# Patient Record
Sex: Male | Born: 2000 | Race: Black or African American | Hispanic: No | Marital: Single | Smoking: Never smoker
Health system: Southern US, Community
[De-identification: ages and names within clinical notes are randomized; demographics above are authoritative.]

---
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
## 2018-02-17 IMAGING — MR MR KNEE*R* W/O CM
6 series · 40 of 40 positions shown · non-contrast
Comparison: Radiographs dated 07/04/2016

CLINICAL DATA: Anterior pain and swelling since a basketball injury
last [REDACTED].

EXAM:
MRI OF THE RIGHT KNEE WITHOUT CONTRAST
TECHNIQUE: Multiplanar, multisequence MR imaging of the knee was performed. No
intravenous contrast was administered.

[Series 3: PD fat-sat · axial · 3.0mm · 0.33mm/px · z∈[-20,+92]mm · 7 of 35 slices shown (1 of 4)]
[im 1/35]
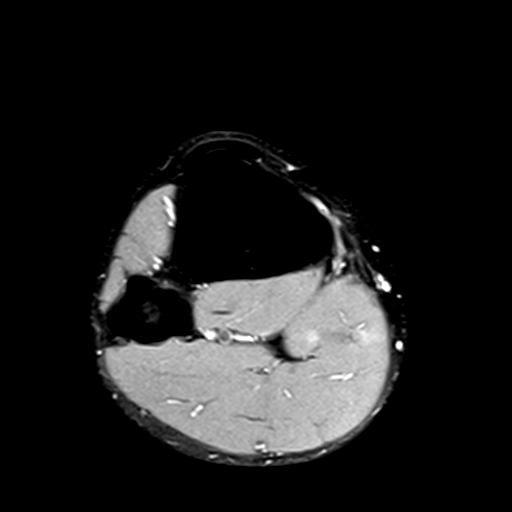
[im 6/35]
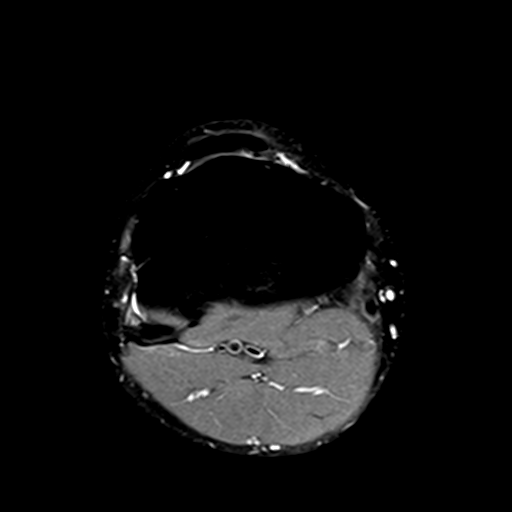
[im 12/35]
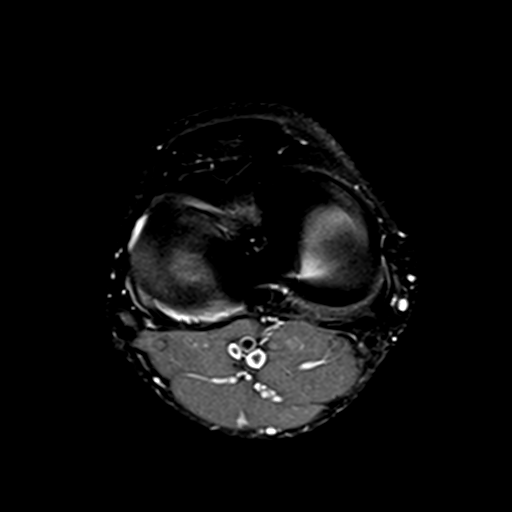
[im 18/35]
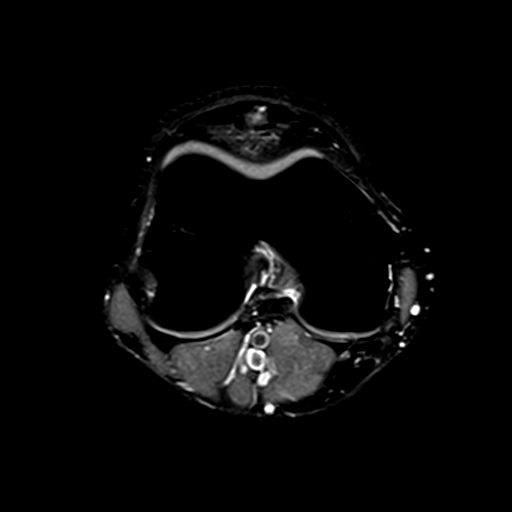
[im 23/35]
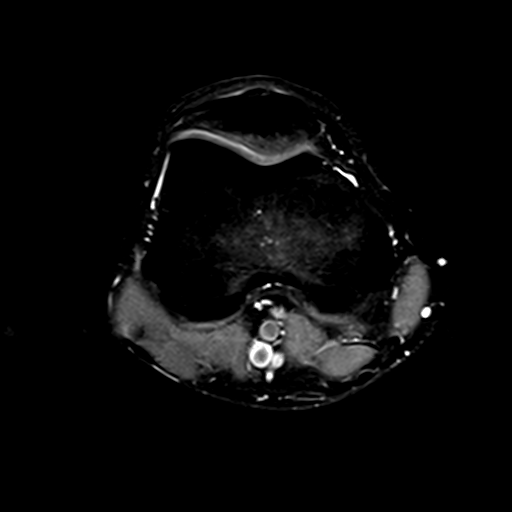
[im 29/35]
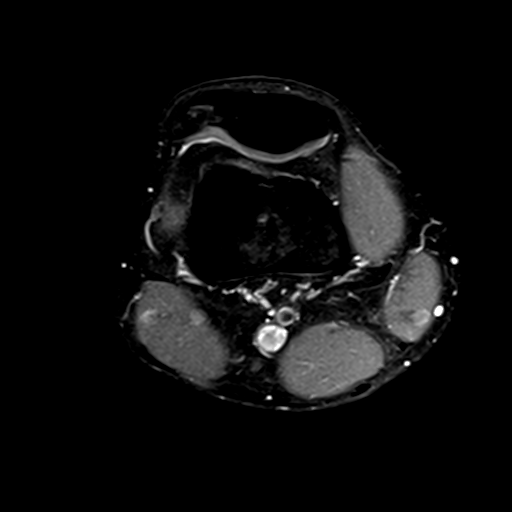
[im 35/35]
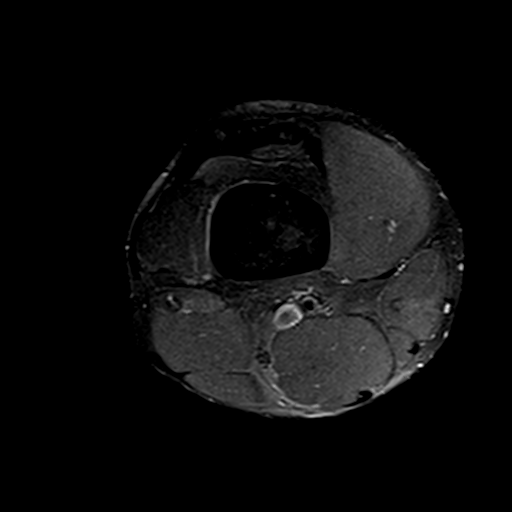

[Series 4: T1 · coronal · 3.0mm · 0.50mm/px · 7 of 31 slices shown]
[im 1/31]
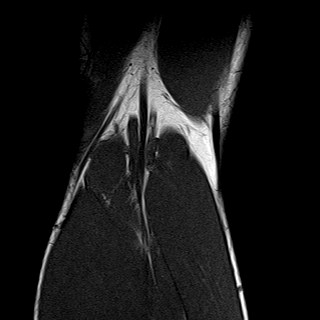
[im 6/31]
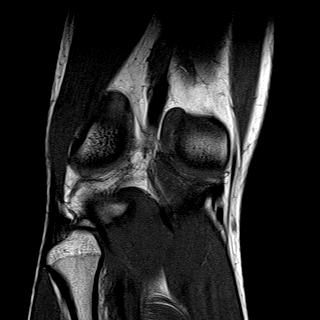
[im 11/31]
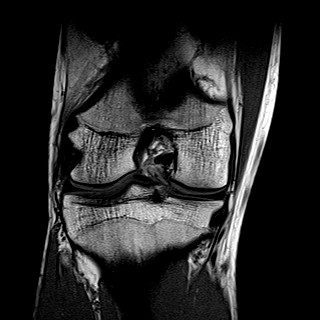
[im 16/31]
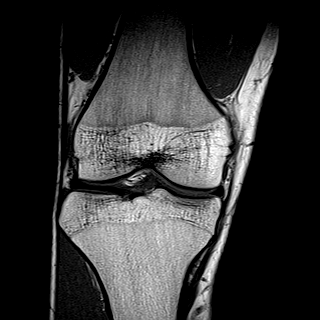
[im 21/31]
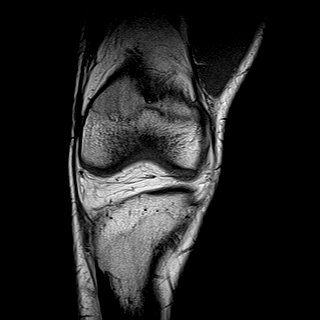
[im 26/31]
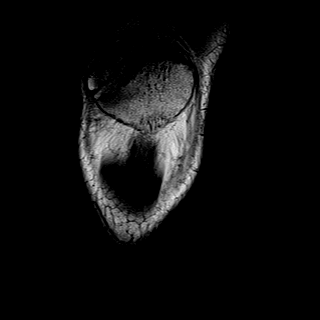
[im 31/31]
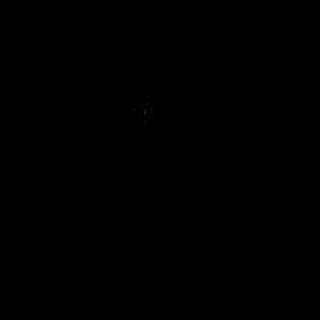

[Series 5: T2 fat-sat · coronal · 3.0mm · 0.50mm/px · 7 of 31 slices shown]
[im 1/31]
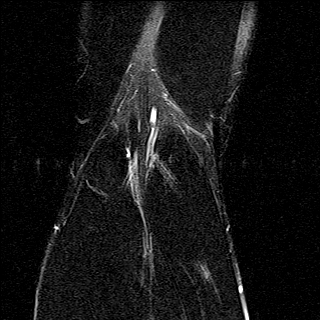
[im 6/31]
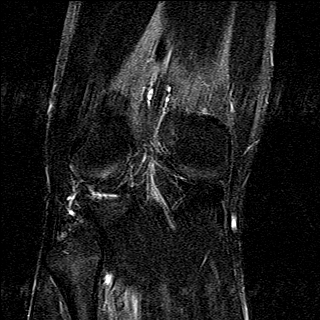
[im 11/31]
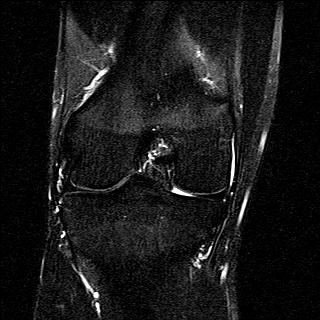
[im 16/31]
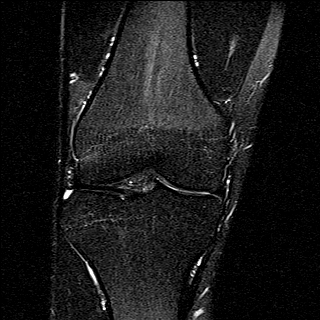
[im 21/31]
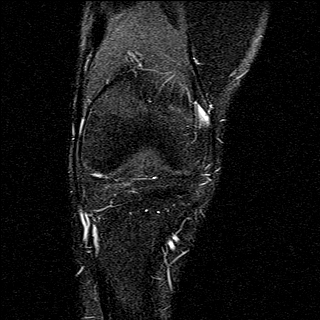
[im 26/31]
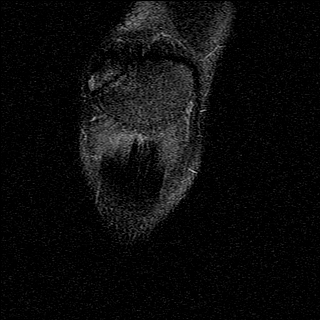
[im 31/31]
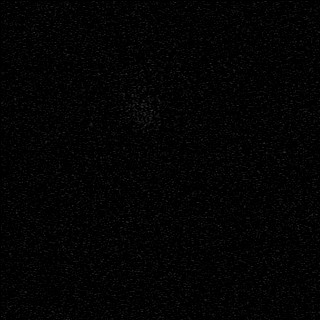

[Series 6: PD fat-sat · coronal · 3.0mm · 0.62mm/px · 7 of 31 slices shown (2 of 4)]
[im 1/31]
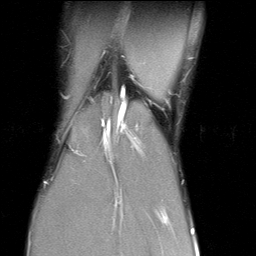
[im 6/31]
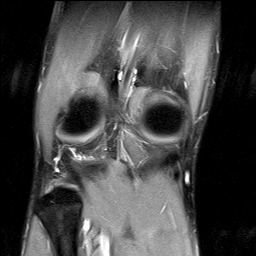
[im 11/31]
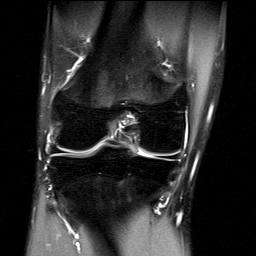
[im 16/31]
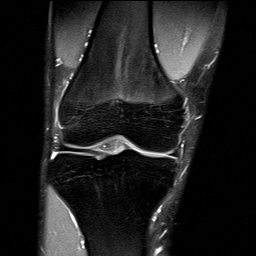
[im 21/31]
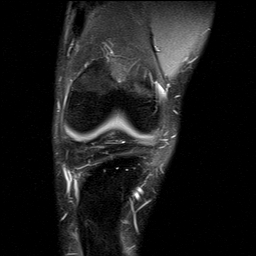
[im 26/31]
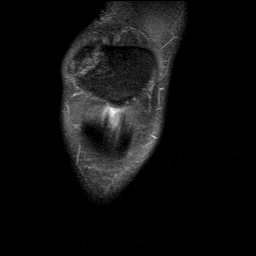
[im 31/31]
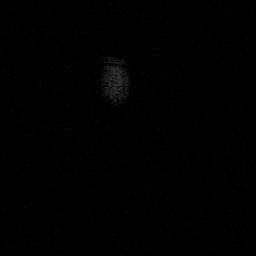

[Series 7: PD fat-sat · sagittal · 3.0mm · 0.62mm/px · 8 of 35 slices shown (3 of 4)]
[im 1/35]
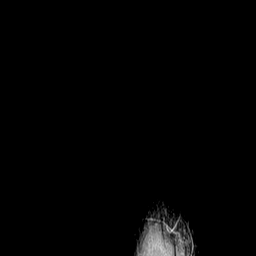
[im 5/35]
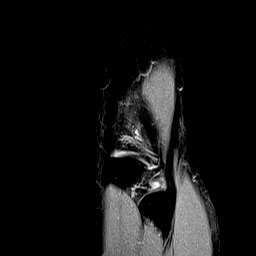
[im 10/35]
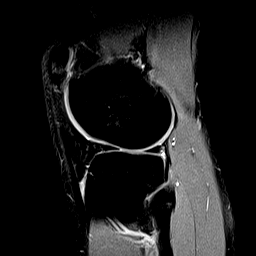
[im 15/35]
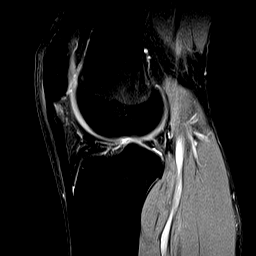
[im 20/35]
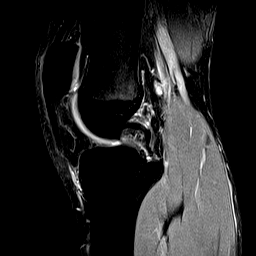
[im 25/35]
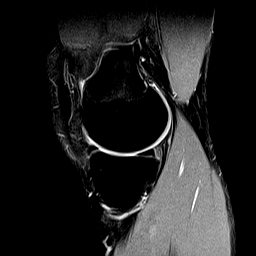
[im 30/35]
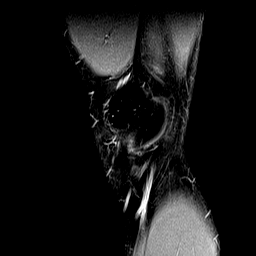
[im 35/35]
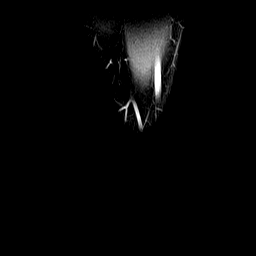

[Series 8: PD fat-sat · oblique · 2.0mm · 0.62mm/px · 4 of 19 slices shown (4 of 4)]
[im 1/19]
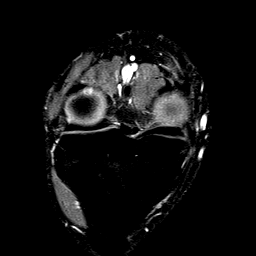
[im 7/19]
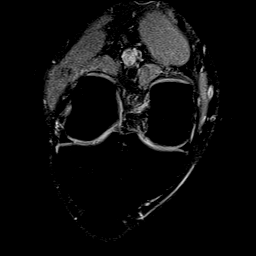
[im 13/19]
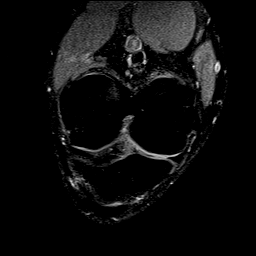
[im 19/19]
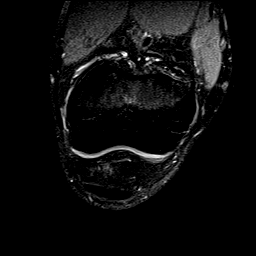

[40 of 40 positions shown; findings below may reference images not displayed]

FINDINGS: MENISCI

Medial meniscus:  Normal.

Lateral meniscus:  Normal.

LIGAMENTS

Cruciates:  Intact ACL and PCL.

Collaterals: Medial collateral ligament is intact. Lateral
collateral ligament complex is intact.

CARTILAGE

Patellofemoral: Normal. Bipartite patella with intact overlying
articular cartilage.

Medial:  Normal.

Lateral:  Normal.

Joint:  Normal.

Popliteal Fossa:  Normal.

Extensor Mechanism: Focal patellar tendinitis with a small
intrasubstance tear of the central portion of the proximal patellar
tendon. Distal quadriceps tendon is normal.

Bones:  Normal.

Other: None
IMPRESSION: Focal patellar tendinitis with a small intrasubstance tear of the
central portion of the proximal patellar tendon.

## 2018-07-04 ENCOUNTER — Other Ambulatory Visit: Payer: Self-pay

## 2018-07-04 ENCOUNTER — Ambulatory Visit (INDEPENDENT_AMBULATORY_CARE_PROVIDER_SITE_OTHER): Payer: BC Managed Care – PPO

## 2018-07-04 ENCOUNTER — Encounter: Payer: Self-pay | Admitting: Sports Medicine

## 2018-07-04 ENCOUNTER — Ambulatory Visit (INDEPENDENT_AMBULATORY_CARE_PROVIDER_SITE_OTHER): Payer: BC Managed Care – PPO | Admitting: Sports Medicine

## 2018-07-04 DIAGNOSIS — M25532 Pain in left wrist: Secondary | ICD-10-CM | POA: Diagnosis not present

## 2018-07-04 MED ORDER — DICLOFENAC SODIUM 75 MG PO TBEC: 75.0000 mg | DELAYED_RELEASE_TABLET | Freq: Two times a day (BID) | ORAL | 3 refills | Status: AC

## 2018-07-04 NOTE — Patient Instructions (Signed)
Return to see me in a month, MRI arthrogram if no better. Visit needs to be in person on a Monday in case we need to do the arthrogram on the same day.

## 2018-07-04 NOTE — Progress Notes (Signed)
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  4 years ago he had a Salter-Harris type II fracture of the distal radius, underwent closed reduction in the emergency department.  He was anatomic, eventually did well.  He did have complete relief of his pain.  Unfortunately soon after he healed he developed recurrence of pain at the radiocarpal joint, it was for the most part mild until recently, started to get worse.  No mechanical symptoms, no new trauma.  I reviewed the past medical history, family history, social history, surgical history, and allergies today and no changes were needed.  Please see the problem list section below in epic for further details.  Past Medical History: No past medical history on file. Past Surgical History: No past surgical history on file. Social History: Social History   Socioeconomic History  . Marital status: Single    Spouse name: Not on file  . Number of children: Not on file  . Years of education: Not on file  . Highest education level: Not on file  Occupational History  . Not on file  Social Needs  . Financial resource strain: Not on file  . Food insecurity:    Worry: Not on file    Inability: Not on file  . Transportation needs:    Medical: Not on file    Non-medical: Not on file  Tobacco Use  . Smoking status: Never Smoker  . Smokeless tobacco: Never Used  Substance and Sexual Activity  . Alcohol use: Not on file  . Drug use: Not on file  . Sexual activity: Not on file  Lifestyle  . Physical activity:    Days per week: Not on file    Minutes per session: Not on file  . Stress: Not on file  Relationships  . Social connections:    Talks on phone: Not on file    Gets together: Not on file    Attends religious service: Not on file    Active member of club or organization: Not on file    Attends meetings of clubs or organizations: Not on file    Relationship  status: Not on file  Other Topics Concern  . Not on file  Social History Narrative  . Not on file   Family History: No family history on file. Allergies: Allergies  Allergen Reactions  . Other Rash    Peanuts  (MILD RX PER MOM)   Medications: See med rec.  Review of Systems: No fevers, chills, night sweats, weight loss, chest pain, or shortness of breath.   Objective:    General: Well Developed, well nourished, and in no acute distress.  Neuro: Alert and oriented x3, extra-ocular muscles intact, sensation grossly intact.  HEENT: Normocephalic, atraumatic, pupils equal round reactive to light, neck supple, no masses, no lymphadenopathy, thyroid nonpalpable.  Skin: Warm and dry, no rashes. Cardiac: Regular rate and rhythm, no murmurs rubs or gallops, no lower extremity edema.  Respiratory: Clear to auscultation bilaterally. Not using accessory muscles, speaking in full sentences. Left wrist: Inspection normal with no visible erythema or swelling. ROM smooth and normal with good flexion and extension and ulnar/radial deviation that is symmetrical with opposite wrist. Palpation is normal over metacarpals, navicular, lunate, and TFCC; tendons without tenderness/ swelling No snuffbox tenderness. No tenderness over Canal of [place]. Strength 5/5 in all directions without pain. Negative tinel's and phalens signs. Negative Finkelstein sign. Negative Watson's  test.  Impression and Recommendations:    Left wrist pain Persistent pain at the radiocarpal joint. He did have a closed reduction of a Salter-Harris type II distal radius fracture at an outside emergency department 4 years ago. Velcro brace, x-rays, formal physical therapy. Return to see me in a month, MRI arthrogram if no better. Visit needs to be in person on a Monday in case we need to do the arthrogram on the same day.   ___________________________________________ Ihor Austin. Benjamin Stain, M.D., ABFM., CAQSM. Primary Care  and Sports Medicine [facility]  Adjunct Professor of Family Medicine  [employer]

## 2018-07-04 NOTE — Assessment & Plan Note (Signed)
Persistent pain at the radiocarpal joint. He did have a closed reduction of a Salter-Harris type II distal radius fracture at an outside emergency department 4 years ago. Velcro brace, x-rays, formal physical therapy. Return to see me in a month, MRI arthrogram if no better. Visit needs to be in person on a Monday in case we need to do the arthrogram on the same day.

## 2018-08-04 ENCOUNTER — Ambulatory Visit: Payer: BC Managed Care – PPO | Admitting: Sports Medicine

## 2019-11-03 ENCOUNTER — Encounter: Payer: Self-pay | Admitting: Sports Medicine

## 2019-11-03 ENCOUNTER — Ambulatory Visit (INDEPENDENT_AMBULATORY_CARE_PROVIDER_SITE_OTHER): Payer: BLUE CROSS/BLUE SHIELD | Admitting: Sports Medicine

## 2019-11-03 ENCOUNTER — Other Ambulatory Visit: Payer: Self-pay

## 2019-11-03 DIAGNOSIS — S29012A Strain of muscle and tendon of back wall of thorax, initial encounter: Secondary | ICD-10-CM

## 2019-11-03 NOTE — Progress Notes (Signed)
    Procedures performed today:    None.  Independent interpretation of notes and tests performed by another provider:   None.  Brief History, Exam, Impression, and Recommendations:    Jordan Herman is a [place] male who comes in with complaints of left sided back pain that began after he was playing basketball last week. He felt the pain during practice but played through. He continued to practice and lift throughout the week with some pain. He does not remember any trauma. The pain is located on the lateral left side of his back around the 12th rib. He endorsed pain to palpation of the area. This is most likely a strain of his lattisimus dorsi. We advised him to stretch and take ibuprofen as needed. He can return to see [place] in a month if it has not improved.   Jordan Herman, MS3   ___________________________________________ Ihor Austin. Benjamin Stain, M.D., ABFM., CAQSM. Primary Care and Sports Medicine [facility]  Adjunct Instructor of Family Medicine  [employer]

## 2019-11-03 NOTE — Assessment & Plan Note (Signed)
Jordan Herman was playing basketball, he had a latissimus dorsi strain on the right approximately a week ago, I think he can just do some ibuprofen as needed and return to see me if not better in a month. We showed him how to stretch his latissimus, no restrictions in activities, return as needed.

## 2020-11-14 ENCOUNTER — Telehealth: Payer: Self-pay | Admitting: Physician Assistant

## 2020-11-14 NOTE — Telephone Encounter (Signed)
Yes, sorry about that, he may consider one of the other sports doctors in the [facility] network unless he can wait till next week.  NSAIDs and Tylenol in the meantime.

## 2020-11-14 NOTE — Telephone Encounter (Signed)
Mother called in wanting to know if patient can be worked in on Thursday or Friday. Patient is only here this week from college and is having back pain. I told her you were booked this week but she still wanted me to ask.

## 2020-11-14 NOTE — Telephone Encounter (Signed)
Left message with recommendations for patient.

## 2022-10-05 ENCOUNTER — Ambulatory Visit: Payer: BLUE CROSS/BLUE SHIELD | Admitting: Sports Medicine

## 2022-10-11 ENCOUNTER — Ambulatory Visit (INDEPENDENT_AMBULATORY_CARE_PROVIDER_SITE_OTHER): Payer: BC Managed Care – PPO | Admitting: Sports Medicine

## 2022-10-11 ENCOUNTER — Ambulatory Visit (INDEPENDENT_AMBULATORY_CARE_PROVIDER_SITE_OTHER): Payer: BC Managed Care – PPO

## 2022-10-11 DIAGNOSIS — M5416 Radiculopathy, lumbar region: Secondary | ICD-10-CM

## 2022-10-11 MED ORDER — PREDNISONE 50 MG PO TABS: ORAL_TABLET | ORAL | 0 refills | Status: DC

## 2022-10-11 NOTE — Assessment & Plan Note (Signed)
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 She has been doing physical therapy on and off with his athletic trainers for over a year now. He has gotten lots of stretching, dry needling, and core conditioning without sufficient improvement. At this point I think is reasonable to pursue advanced imaging considering duration of treatment in spite of conservative care. Proceeding with x-rays, MRI, I do expect that he has a herniated disc versus spondylolisthesis. We will start with 5 days of prednisone, additional home core conditioning, if insufficient improvement after maybe 4 weeks and we confirm a herniated disc we will set him up with an epidural and spinal surgery consultation. He does have to report back in September.

## 2022-10-11 NOTE — Progress Notes (Signed)
    Procedures performed today:    None.  Independent interpretation of notes and tests performed by another provider:   None.  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 She has been doing physical therapy on and off with his athletic trainers for over a year now. He has gotten lots of stretching, dry needling, and core conditioning without sufficient improvement. At this point I think is reasonable to pursue advanced imaging considering duration of treatment in spite of conservative care. Proceeding with x-rays, MRI, I do expect that he has a herniated disc versus spondylolisthesis. We will start with 5 days of prednisone, additional home core conditioning, if insufficient improvement after maybe 4 weeks and we confirm a herniated disc we will set him up with an epidural and spinal surgery consultation. He does have to report back in September.    ____________________________________________ Ihor Austin. Benjamin Stain, M.D., ABFM., CAQSM., AME. Primary Care and Sports Medicine [facility]  Adjunct Professor of Family Medicine  [employer] [employer]  Restaurant manager, fast food

## 2022-10-12 ENCOUNTER — Other Ambulatory Visit: Payer: Self-pay | Admitting: Sports Medicine

## 2022-10-12 MED ORDER — TRIAZOLAM 0.25 MG PO TABS: ORAL_TABLET | ORAL | 0 refills | Status: DC

## 2022-10-14 ENCOUNTER — Ambulatory Visit (INDEPENDENT_AMBULATORY_CARE_PROVIDER_SITE_OTHER): Payer: BC Managed Care – PPO

## 2022-10-14 DIAGNOSIS — M5416 Radiculopathy, lumbar region: Secondary | ICD-10-CM

## 2022-10-16 ENCOUNTER — Telehealth: Payer: Self-pay | Admitting: Sports Medicine

## 2022-10-16 NOTE — Telephone Encounter (Signed)
Mom called and is following up on MRI results  Please contact patient at [phone]

## 2022-10-16 NOTE — Telephone Encounter (Signed)
The official radiologist report is not back yet however I did review the MRI myself and I do see a large L5-S1 disc herniation/extrusion.  If okay with him we can go and set him up with spinal surgery but only for consultation, and if insufficient improvement from the prednisone I can order the epidural.

## 2022-10-17 ENCOUNTER — Telehealth: Payer: Self-pay | Admitting: Physician Assistant

## 2022-10-17 DIAGNOSIS — M5416 Radiculopathy, lumbar region: Secondary | ICD-10-CM

## 2022-10-17 MED ORDER — GABAPENTIN 300 MG PO CAPS
ORAL_CAPSULE | ORAL | 3 refills | Status: AC
Start: 2022-10-17 — End: ?

## 2022-10-17 NOTE — Telephone Encounter (Signed)
Ms. Latulippe calls. Jordan Herman is still in pain and he is done with Prednisone.  He wants to go ahead with epidural.  What can you give him in the meantime for his pain?

## 2022-10-17 NOTE — Telephone Encounter (Signed)
Adding Neurontin for the pain.  Ordering epidural.  Neurontin will be taken in an up taper starting at nightly dosing only, I would like to see him back 4 to 6 weeks after the epidural injection, he will get a phone call from [facility] imaging.

## 2022-10-17 NOTE — Telephone Encounter (Signed)
Patient's mom notified of results.

## 2022-10-19 ENCOUNTER — Ambulatory Visit: Payer: BC Managed Care – PPO | Admitting: Sports Medicine

## 2022-10-23 ENCOUNTER — Telehealth: Payer: Self-pay | Admitting: Sports Medicine

## 2022-10-23 NOTE — Telephone Encounter (Signed)
Patient called he is what is the dx on MRI which le

## 2022-10-29 ENCOUNTER — Ambulatory Visit
Admission: RE | Admit: 2022-10-29 | Discharge: 2022-10-29 | Disposition: A | Discharge status: Home / Self Care | Payer: BC Managed Care – PPO | Source: Ambulatory Visit | Attending: Sports Medicine | Admitting: Sports Medicine

## 2022-10-29 DIAGNOSIS — M5416 Radiculopathy, lumbar region: Secondary | ICD-10-CM

## 2022-10-29 MED ORDER — IOPAMIDOL (ISOVUE-M 200) INJECTION 41%
1.0000 mL | Freq: Once | INTRAMUSCULAR | Status: AC
  Administered 2022-10-29: 1 mL via EPIDURAL

## 2022-10-29 MED ORDER — METHYLPREDNISOLONE ACETATE 40 MG/ML INJ SUSP (RADIOLOG
80.0000 mg | Freq: Once | INTRAMUSCULAR | Status: AC
  Administered 2022-10-29: 80 mg via EPIDURAL

## 2022-10-29 NOTE — Discharge Instructions (Signed)

## 2022-11-01 ENCOUNTER — Ambulatory Visit (INDEPENDENT_AMBULATORY_CARE_PROVIDER_SITE_OTHER): Payer: BC Managed Care – PPO | Admitting: Sports Medicine

## 2022-11-01 ENCOUNTER — Encounter: Payer: Self-pay | Admitting: Sports Medicine

## 2022-11-01 DIAGNOSIS — M5416 Radiculopathy, lumbar region: Secondary | ICD-10-CM

## 2022-11-01 NOTE — Assessment & Plan Note (Signed)
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 Ultimately we got an MRI that showed a large L5-S1 herniated disc, he did have an epidural and is improving considerably. I would like him to hold off on any additional working out for 2 weeks before starting his training again.

## 2022-11-01 NOTE — Progress Notes (Signed)
    Procedures performed today:    None.  Independent interpretation of notes and tests performed by another provider:   None.  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 Ultimately we got an MRI that showed a large L5-S1 herniated disc, he did have an epidural and is improving considerably. I would like him to hold off on any additional working out for 2 weeks before starting his training again.    ____________________________________________ Ihor Austin. Benjamin Stain, M.D., ABFM., CAQSM., AME. Primary Care and Sports Medicine [facility]  Adjunct Professor of Family Medicine  [place] [employer] [facility] [employer]  Restaurant manager, fast food

## 2022-11-28 ENCOUNTER — Ambulatory Visit (INDEPENDENT_AMBULATORY_CARE_PROVIDER_SITE_OTHER): Payer: BC Managed Care – PPO | Admitting: Sports Medicine

## 2022-11-28 ENCOUNTER — Encounter: Payer: Self-pay | Admitting: Sports Medicine

## 2022-11-28 DIAGNOSIS — M5416 Radiculopathy, lumbar region: Secondary | ICD-10-CM

## 2022-11-28 NOTE — Assessment & Plan Note (Signed)
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 Ultimately we obtained an MRI that showed a large L5-S1 herniated disc, he had an epidural, he returns today and tells me he is completely pain-free, epidural was about 2-3 weeks ago. I informed him the recurrent nature of this problem, and the importance of keeping his core exquisitely strong. He does understand that if he has another flare we could certainly try course of steroids followed by an epidural if need be. We also discussed avoiding exercises resulting in deep axial flexion of the spine. Considering his improvement I can see him back on an as-needed basis. I wished him luck in the [facility] draft.

## 2022-11-28 NOTE — Progress Notes (Signed)
    Procedures performed today:    None.  Independent interpretation of notes and tests performed by another provider:   None.  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 Ultimately we obtained an MRI that showed a large L5-S1 herniated disc, he had an epidural, he returns today and tells me he is completely pain-free, epidural was about 2-3 weeks ago. I informed him the recurrent nature of this problem, and the importance of keeping his core exquisitely strong. He does understand that if he has another flare we could certainly try course of steroids followed by an epidural if need be. We also discussed avoiding exercises resulting in deep axial flexion of the spine. Considering his improvement I can see him back on an as-needed basis. I wished him luck in the [facility] draft.    ____________________________________________ Ihor Austin. Benjamin Stain, M.D., ABFM., CAQSM., AME. Primary Care and Sports Medicine [facility]  Adjunct Professor of Family Medicine  [place] [employer] [facility] [employer]  Restaurant manager, fast food

## 2023-08-29 ENCOUNTER — Ambulatory Visit

## 2023-08-29 ENCOUNTER — Encounter: Payer: Self-pay | Admitting: Sports Medicine

## 2023-08-29 ENCOUNTER — Ambulatory Visit (INDEPENDENT_AMBULATORY_CARE_PROVIDER_SITE_OTHER): Payer: Self-pay | Admitting: Sports Medicine

## 2023-08-29 DIAGNOSIS — M25512 Pain in left shoulder: Secondary | ICD-10-CM

## 2023-08-29 DIAGNOSIS — S43432S Superior glenoid labrum lesion of left shoulder, sequela: Secondary | ICD-10-CM | POA: Diagnosis not present

## 2023-08-29 DIAGNOSIS — M25562 Pain in left knee: Secondary | ICD-10-CM

## 2023-08-29 DIAGNOSIS — M7651 Patellar tendinitis, right knee: Secondary | ICD-10-CM

## 2023-08-29 MED ORDER — NITROGLYCERIN 0.2 MG/HR TD PT24: MEDICATED_PATCH | TRANSDERMAL | 11 refills | Status: AC

## 2023-08-29 NOTE — Assessment & Plan Note (Signed)
 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 We did PRP percutaneous tenotomy back in 2018 for patellar tendinitis confirmed on MRI, he had failed conservative treatment at the time. He did really well until now. Today his knee has good motion, good strength, all ligaments are stable, he does have discrete tenderness at the proximal patellar tendon at the insertion into the patella. This is all consistent with patellar tendinopathy. He will continue with bracing, we will add eccentric conditioning, topical nitroglycerin , x-rays. I would like to see him back in just over 6 weeks and if still having discomfort we can discuss PRP. The season does restart sometime in the fall so we will need to time this appropriately so that he can recover from PRP if need be.

## 2023-08-29 NOTE — Assessment & Plan Note (Signed)
 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 His trainers have told him that he is having subluxations. At this point he does not entirely trust the shoulder, he is limited on certain activities on the court particular when playing defense and doing ball screens. On exam he has good motion and good strength but he does have a positive apprehension sign, a positive relocation sign, translational instability and a positive O'Brien's test all concerning for shoulder instability and labral injury. Due to his high demand shoulder we will proceed with x-rays and MR arthrography, I will see him back for the MR arthrogram injection. As above considering his high demand shoulder we would likely get him referred to Dr. Yvonne Hering sooner rather than later as well.

## 2023-08-29 NOTE — Progress Notes (Signed)
    Procedures performed today:    None.  Independent interpretation of notes and tests performed by another provider:   None.  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 His trainers have told him that he is having subluxations. At this point he does not entirely trust the shoulder, he is limited on certain activities on the court particular when playing defense and doing ball screens. On exam he has good motion and good strength but he does have a positive apprehension sign, a positive relocation sign, translational instability and a positive O'Brien's test all concerning for shoulder instability and labral injury. Due to his high demand shoulder we will proceed with x-rays and MR arthrography, I will see him back for the MR arthrogram injection. As above considering his high demand shoulder we would likely get him referred to Dr. Yvonne Hering sooner rather than later as well.  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 We did PRP percutaneous tenotomy back in 2018 for patellar tendinitis confirmed on MRI, he had failed conservative treatment at the time. He did really well until now. Today his knee has good motion, good strength, all ligaments are stable, he does have discrete tenderness at the proximal patellar tendon at the insertion into the patella. This is all consistent with patellar tendinopathy. He will continue with bracing, we will add eccentric conditioning, topical nitroglycerin , x-rays. I would like to see him back in just over 6 weeks and if still having discomfort we can discuss PRP. The season does restart sometime in the fall so we will need to time this appropriately so that he can recover  from PRP if need be.    ____________________________________________ Joselyn Nicely. Sandy Crumb, M.D., ABFM., CAQSM., AME. Primary Care and Sports Medicine [facility]  Adjunct Professor of [facility] Medicine  [employer]  Restaurant manager, fast food

## 2023-09-02 ENCOUNTER — Other Ambulatory Visit: Payer: Self-pay | Admitting: Sports Medicine

## 2023-09-02 MED ORDER — TRIAZOLAM 0.25 MG PO TABS: ORAL_TABLET | ORAL | 0 refills | Status: AC

## 2023-09-03 ENCOUNTER — Ambulatory Visit: Payer: Self-pay | Admitting: Sports Medicine

## 2023-09-09 ENCOUNTER — Ambulatory Visit

## 2023-09-09 ENCOUNTER — Encounter: Payer: Self-pay | Admitting: Sports Medicine

## 2023-09-09 ENCOUNTER — Ambulatory Visit (INDEPENDENT_AMBULATORY_CARE_PROVIDER_SITE_OTHER)

## 2023-09-09 ENCOUNTER — Ambulatory Visit (INDEPENDENT_AMBULATORY_CARE_PROVIDER_SITE_OTHER): Admitting: Sports Medicine

## 2023-09-09 DIAGNOSIS — S43432D Superior glenoid labrum lesion of left shoulder, subsequent encounter: Secondary | ICD-10-CM | POA: Diagnosis not present

## 2023-09-09 DIAGNOSIS — M25512 Pain in left shoulder: Secondary | ICD-10-CM | POA: Diagnosis not present

## 2023-09-09 DIAGNOSIS — S43432S Superior glenoid labrum lesion of left shoulder, sequela: Secondary | ICD-10-CM

## 2023-09-09 MED ORDER — TRIAMCINOLONE ACETONIDE 40 MG/ML IJ SUSP
40.0000 mg | Freq: Once | INTRAMUSCULAR | Status: AC
  Administered 2023-09-09: 40 mg via INTRAMUSCULAR

## 2023-09-09 NOTE — Progress Notes (Signed)
    Procedures performed today:    Procedure: Real-time Ultrasound Guided gadolinium contrast injection of left glenohumeral joint Device: Samsung HS60  Verbal informed consent obtained.  Time-out conducted.  Noted no overlying erythema, induration, or other signs of local infection.  Skin prepped in a sterile fashion.  Local anesthesia: Topical Ethyl chloride.  With sterile technique and under real time ultrasound guidance: Joint visualized, noted some posterior labral and glenoid disruption, 22-gauge spinal needle advanced into the joint from a posterior approach, I injected 1 cc kenalog 40, 2 cc lidocaine, 2 cc bupivacaine, syringe switched and 0.1 cc gadolinium injected, syringe again switched and 10 cc sterile saline used to fully distend the joint. Joint visualized and capsule seen distending confirming intra-articular placement of contrast material and medication. Completed without difficulty  Advised to call if fevers/chills, erythema, induration, drainage, or persistent bleeding.  Images permanently stored in PACS Impression: Technically successful ultrasound guided gadolinium contrast injection for MR arthrography.  Please see separate MR arthrogram report.  Independent interpretation of notes and tests performed by another provider:   None.  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 His trainers have told him that he is having subluxations. At this point he does not entirely trust the shoulder, he is limited on certain activities on the court particular when playing defense and doing ball screens. On exam he has good motion and good strength but he does have a positive apprehension sign, a positive relocation sign, translational instability and a positive O'Brien's  test all concerning for shoulder instability and labral injury. Due to his high demand shoulder we will proceed with x-rays and MR arthrography, I will see him back for the MR arthrogram injection. As above considering his high demand shoulder we would likely get him referred to Dr. Yvonne Hering sooner rather than later as well.  Today: Injection performed today for MR arthrography.  If we do see a large amount of labral tearing and glenoid disruption we will consider referral to Dr. Yvonne Hering for discussion of arthroscopic repair.  Large labral tear noted, referral to Dr. Yvonne Hering    ____________________________________________ Jordan Herman. Jordan Herman, M.D., ABFM., CAQSM., AME. Primary Care and Sports Medicine [facility]  Adjunct Professor of [facility]  [employer]  Restaurant manager, [employer]

## 2023-09-09 NOTE — Addendum Note (Signed)
 Addended by: Montgomery Apgar on: 09/09/2023 09:12 AM   Modules accepted: Orders

## 2023-09-09 NOTE — Assessment & Plan Note (Addendum)
 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 His trainers have told him that he is having subluxations. At this point he does not entirely trust the shoulder, he is limited on certain activities on the court particular when playing defense and doing ball screens. On exam he has good motion and good strength but he does have a positive apprehension sign, a positive relocation sign, translational instability and a positive O'Brien's test all concerning for shoulder instability and labral injury. Due to his high demand shoulder we will proceed with x-rays and MR arthrography, I will see him back for the MR arthrogram injection. As above considering his high demand shoulder we would likely get him referred to Dr. Yvonne Hering sooner rather than later as well.  Today: Injection performed today for MR arthrography.  If we do see a large amount of labral tearing and glenoid disruption we will consider referral to Dr. Yvonne Hering for discussion of arthroscopic repair.  Large labral tear noted, referral to Dr. Yvonne Hering

## 2023-09-11 NOTE — Addendum Note (Signed)
 Addended by: Gean Keels on: 09/11/2023 08:57 PM   Modules accepted: Orders

## 2023-09-12 ENCOUNTER — Ambulatory Visit: Admitting: Sports Medicine

## 2023-09-16 ENCOUNTER — Ambulatory Visit: Admitting: Sports Medicine

## 2023-12-03 ENCOUNTER — Encounter: Payer: Self-pay | Admitting: Sports Medicine
